# Patient Record
Sex: Female | Born: 1970 | Race: White | Hispanic: No | Marital: Married | Smoking: Never smoker
Health system: Southern US, Community
[De-identification: ages and names within clinical notes are randomized; demographics above are authoritative.]

## PROBLEM LIST (undated history)

## (undated) DIAGNOSIS — T7840XA Allergy, unspecified, initial encounter: Secondary | ICD-10-CM

## (undated) DIAGNOSIS — Z803 Family history of malignant neoplasm of breast: Secondary | ICD-10-CM

## (undated) DIAGNOSIS — N809 Endometriosis, unspecified: Secondary | ICD-10-CM

## (undated) HISTORY — DX: Endometriosis, unspecified: N80.9

## (undated) HISTORY — DX: Allergy, unspecified, initial encounter: T78.40XA

## (undated) HISTORY — DX: Family history of malignant neoplasm of breast: Z80.3

---

## 1995-01-15 LAB — HM PAP SMEAR: HM Pap smear: NORMAL

## 2001-05-05 HISTORY — PX: NASAL SINUS SURGERY: SHX719

## 2004-02-21 ENCOUNTER — Ambulatory Visit: Payer: Self-pay | Admitting: General Surgery

## 2006-05-05 HISTORY — PX: BREAST BIOPSY: SHX20

## 2006-07-08 ENCOUNTER — Ambulatory Visit: Payer: Self-pay | Admitting: Unknown Physician Specialty

## 2006-07-10 ENCOUNTER — Ambulatory Visit: Payer: Self-pay | Admitting: Unknown Physician Specialty

## 2007-05-06 HISTORY — PX: CHOLECYSTECTOMY: SHX55

## 2007-05-24 ENCOUNTER — Observation Stay: Payer: Self-pay

## 2007-05-26 ENCOUNTER — Inpatient Hospital Stay: Payer: Self-pay

## 2008-08-27 ENCOUNTER — Observation Stay: Payer: Self-pay

## 2008-11-17 ENCOUNTER — Inpatient Hospital Stay: Payer: Self-pay | Admitting: Obstetrics & Gynecology

## 2008-12-20 ENCOUNTER — Emergency Department: Payer: Self-pay | Admitting: Unknown Physician Specialty

## 2008-12-21 ENCOUNTER — Inpatient Hospital Stay: Payer: Self-pay | Admitting: General Surgery

## 2008-12-27 ENCOUNTER — Ambulatory Visit: Payer: Self-pay | Admitting: General Surgery

## 2009-08-31 IMAGING — US US OB US >=[ID] SNGL FETUS
1 series · 17 of 28 positions shown · non-contrast
Comparison: none

REASON FOR EXAM: upper abdominal pain
COMMENTS:

[Series 1: us ob us >=(id) sngl fetus · 17 of 44 slices shown]
[im 1/44]
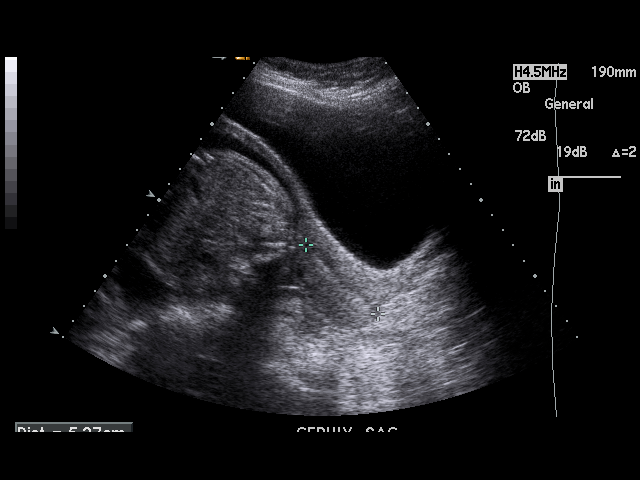
[im 4/44]
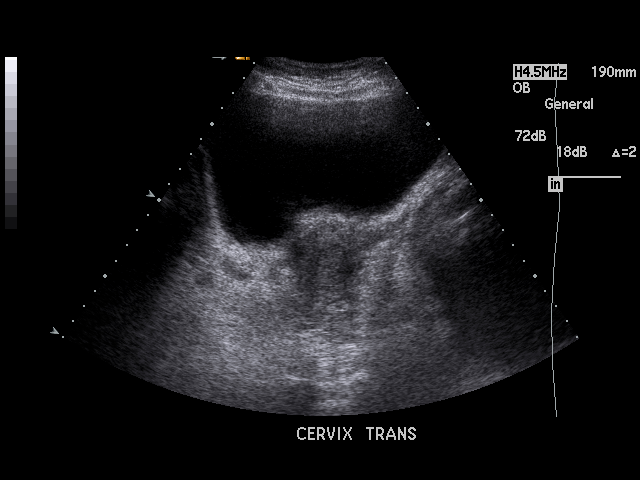
[im 7/44]
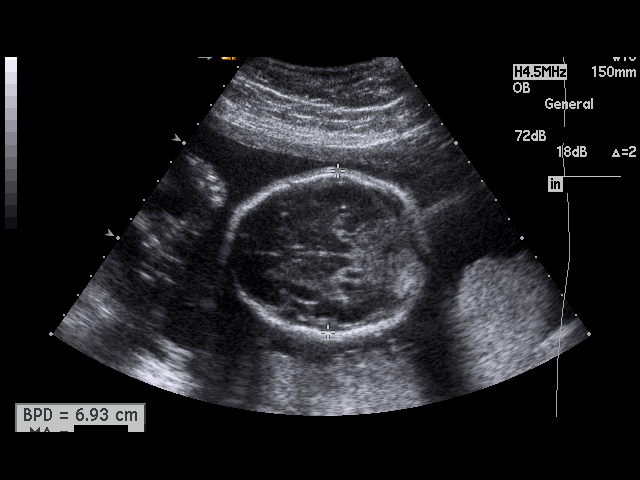
[im 8/44]
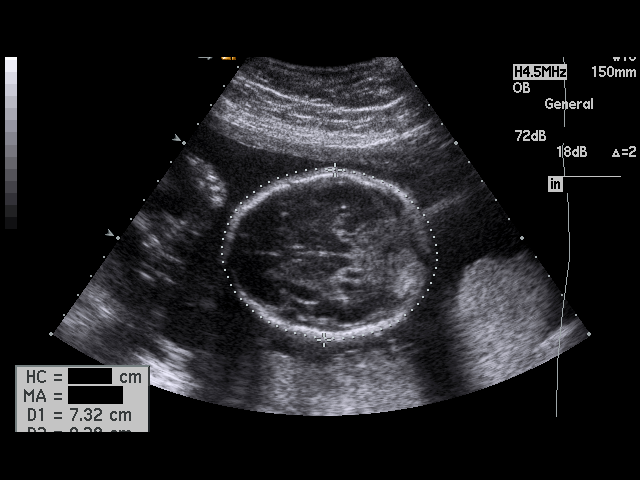
[im 12/44]
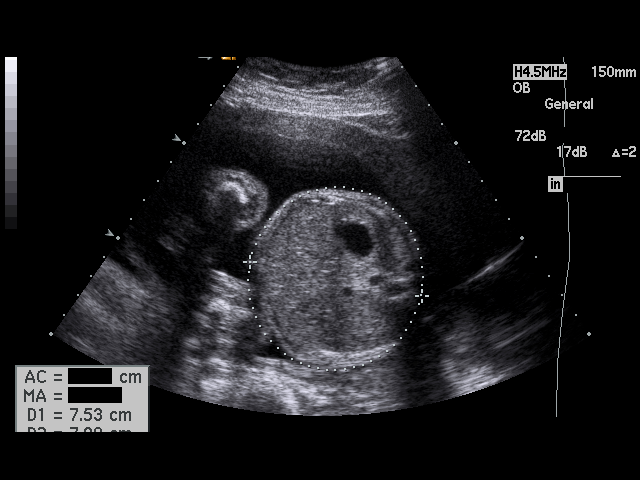
[im 15/44]
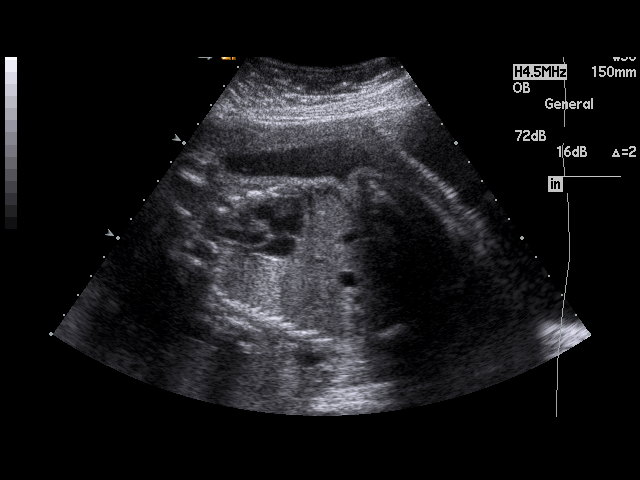
[im 16/44]
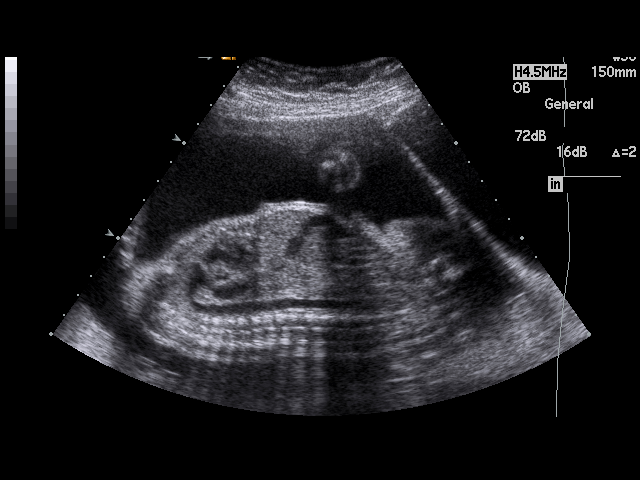
[im 20/44]
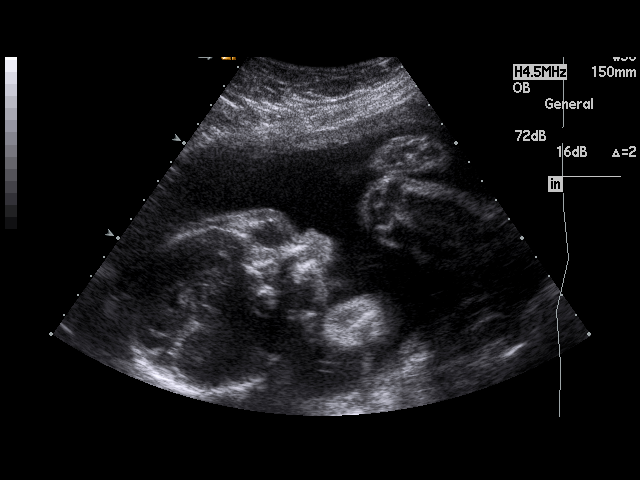
[im 23/44]
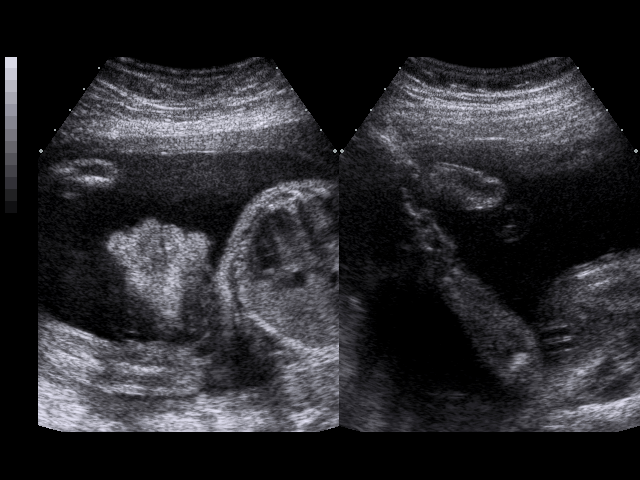
[im 24/44]
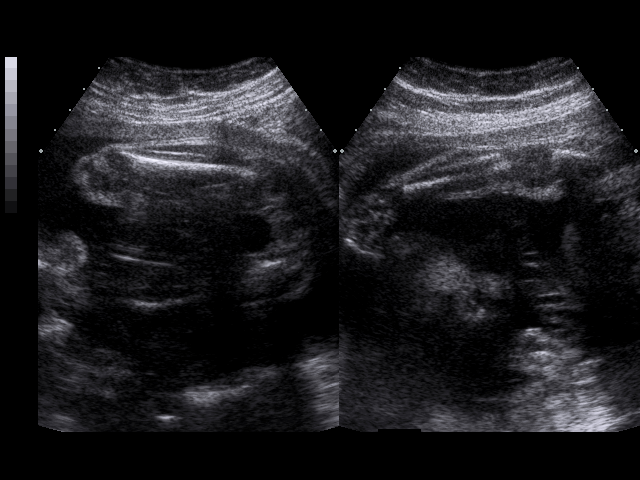
[im 28/44]
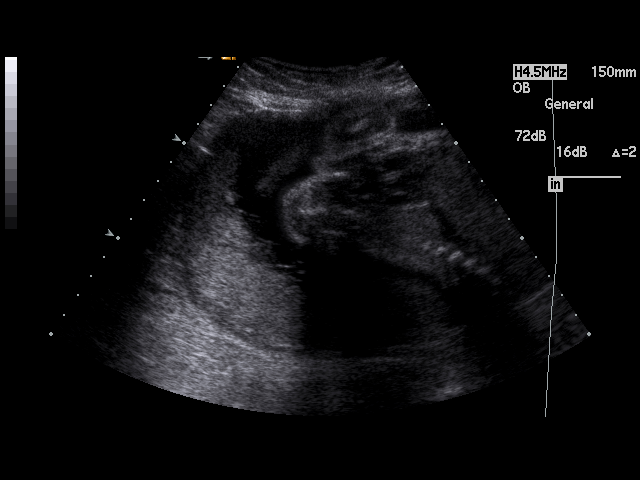
[im 29/44]
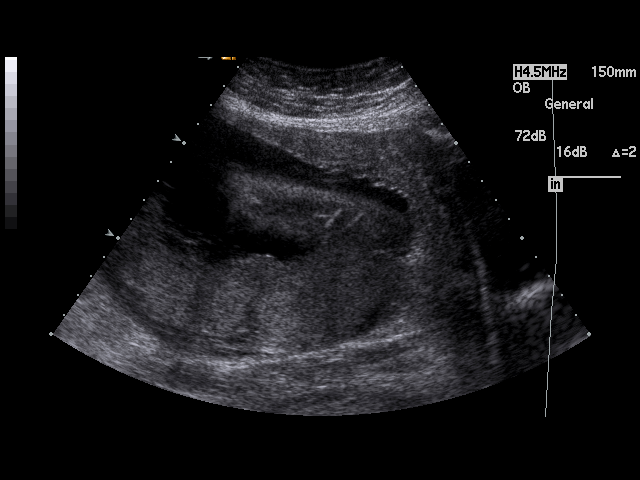
[im 32/44]
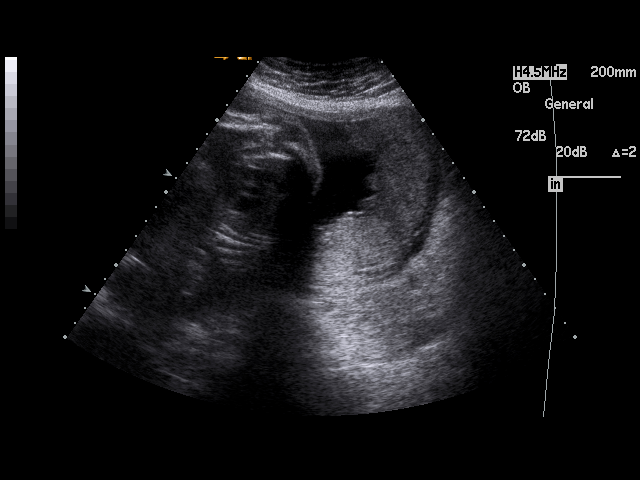
[im 36/44]
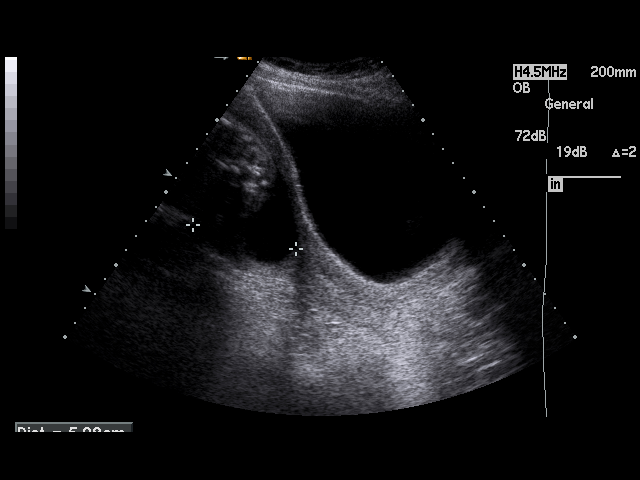
[im 37/44]
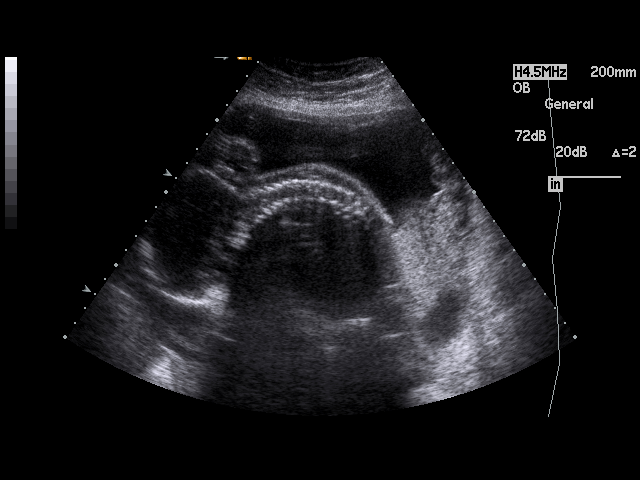
[im 40/44]
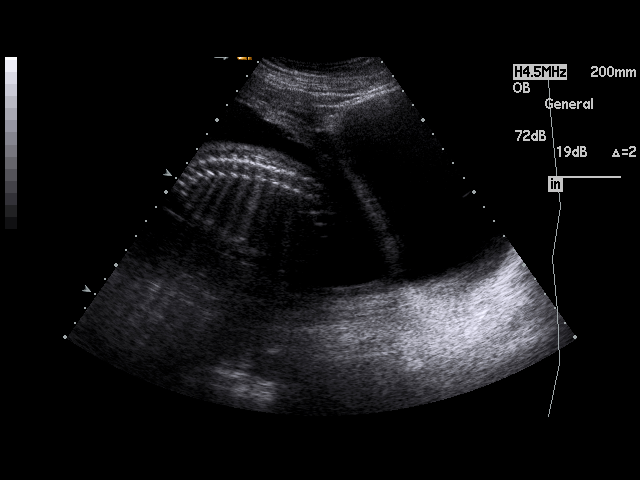
[im 44/44]
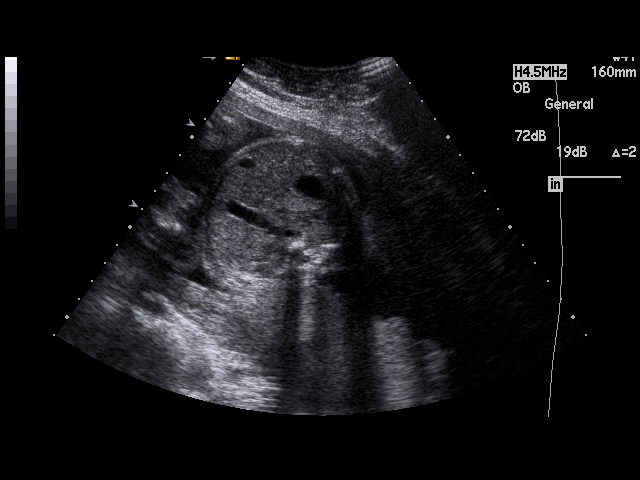

[17 of 28 positions shown; findings below may reference images not displayed]

PROCEDURE:     US  - US OB GREATER/OR EQUAL TO BHFDX  - August 27, 2008  [DATE]

RESULT:     A single viable intrauterine pregnancy is identified with an
estimated gestational age per ultrasound of 28 weeks-4 day, per LMP 27
weeks-3 days, EDC per ultrasound is 11-18-08, per LMP 11-23-2008.

Estimated fetal weight is 0244 grams plus/minus 161 grams, which corresponds
to 2 pounds, 2 ounces.

Fetal biometry:

BPD: 6.89 cm EGA 27 weeks-8 days
HC: 26.14 cm EGA 28 weeks-3 days
AC: 24.9 EGA 28 weeks-3 days
FL: 5.26 EGA 28 weeks-4 day

Estimated fetal heart rate is 136 beats per minute. The placenta is in a
RIGHT lateral posterior location.  Cardiac, trunk, extremity and
diaphragmatic movement are appreciated. Amnionic fluid appears unremarkable.
Evaluation of the fetal anatomy demonstrates no bladder, kidney, stomach,
cardiac, diaphragm, spine or ventricular abnormalities.
IMPRESSION: 1. Single viable intrauterine pregnancy as described above. No sonographic
abnormalities are appreciated.

## 2010-12-02 ENCOUNTER — Ambulatory Visit: Payer: Self-pay | Admitting: Obstetrics & Gynecology

## 2011-02-28 ENCOUNTER — Emergency Department: Payer: Self-pay | Admitting: Emergency Medicine

## 2014-12-11 ENCOUNTER — Ambulatory Visit (INDEPENDENT_AMBULATORY_CARE_PROVIDER_SITE_OTHER): Payer: No Typology Code available for payment source | Admitting: Family Medicine

## 2014-12-11 ENCOUNTER — Encounter: Payer: Self-pay | Admitting: *Deleted

## 2014-12-11 ENCOUNTER — Encounter: Payer: Self-pay | Admitting: Family Medicine

## 2014-12-11 VITALS — BP 102/70 | HR 62 | Temp 98.1°F | Resp 16 | Ht 65.0 in | Wt 175.0 lb

## 2014-12-11 DIAGNOSIS — J309 Allergic rhinitis, unspecified: Secondary | ICD-10-CM | POA: Diagnosis not present

## 2014-12-11 DIAGNOSIS — J019 Acute sinusitis, unspecified: Secondary | ICD-10-CM

## 2014-12-11 DIAGNOSIS — N809 Endometriosis, unspecified: Secondary | ICD-10-CM | POA: Insufficient documentation

## 2014-12-11 DIAGNOSIS — J329 Chronic sinusitis, unspecified: Secondary | ICD-10-CM | POA: Insufficient documentation

## 2014-12-11 MED ORDER — DOXYCYCLINE HYCLATE 100 MG PO TABS: 100.0000 mg | ORAL_TABLET | Freq: Two times a day (BID) | ORAL | Status: AC

## 2014-12-11 MED ORDER — PREDNISONE 10 MG PO TABS: ORAL_TABLET | ORAL | Status: AC

## 2014-12-11 NOTE — Progress Notes (Signed)
Patient: Melissa Neal Female    DOB: [birth year]   44 y.o.   MRN: [id number] Visit Date: 12/11/2014  Today's Provider: Mila Merry, MD   Chief Complaint  Patient presents with  . Sinusitis   Subjective:    Sinusitis This is a new problem. The current episode started in the past 7 days. The problem has been gradually worsening since onset. There has been no fever. Associated symptoms include congestion, coughing, headaches, sinus pressure and sneezing. Pertinent negatives include no chills, ear pain, hoarse voice, shortness of breath or sore throat. Past treatments include oral decongestants and sitting up. The treatment provided no relief.   She has similar sx in February which did not initially respond to Zpack, but rapidly improved on Doxy and prednisone. She states she had cold symptoms for a few weeks before sinus pain started and was frequently using nasal saline. She also has long history of allergic rhinitis for which she uses Flonase, and feels this has been effective until recently. She was previously followed by Dr. Jenne Campus for allergies and has considered getting allergy shots before her last pregnancy.     Allergies  Allergen Reactions  . Amoxicillin Itching  . Levaquin  [Levofloxacin In D5w] Rash   Previous Medications   FLUTICASONE (FLONASE) 50 MCG/ACT NASAL SPRAY    Place 2 sprays into the nose daily.   MULTIPLE VITAMIN PO    Take 1 capsule by mouth daily.    Review of Systems  Constitutional: Negative for chills.  HENT: Positive for congestion, sinus pressure and sneezing. Negative for ear pain, hoarse voice and sore throat.   Respiratory: Positive for cough. Negative for shortness of breath.   Cardiovascular: Positive for palpitations. Negative for chest pain.  Neurological: Positive for headaches. Negative for dizziness and light-headedness.    History  Substance Use Topics  . Smoking status: Never Smoker   . Smokeless tobacco: Not on file  .  Alcohol Use: No   Objective:   BP 102/70 mmHg  Pulse 62  Temp(Src) 98.1 F (36.7 C) (Oral)  Resp 16  Ht 5\' 5"  (1.651 m)  Wt 175 lb (79.379 kg)  BMI 29.12 kg/m2  SpO2 99%  Physical Exam General Appearance:    Alert, cooperative, no distress  HENT:   bilateral TM normal without fluid or infection, neck without nodes, throat normal without erythema or exudate, right frontal sinus tender, post nasal drip noted and nasal mucosa congested  Eyes:    PERRL, conjunctiva/corneas clear, EOM's intact       Lungs:     Clear to auscultation bilaterally, respirations unlabored  Heart:    Regular rate and rhythm  Neurologic:   Awake, alert, oriented x 3. No apparent focal neurological           defect.            Assessment & Plan:     1. Acute sinusitis, recurrence not specified, unspecified location  - doxycycline (VIBRA-TABS) 100 MG tablet; Take 1 tablet (100 mg total) by mouth 2 (two) times daily.  Dispense: 20 tablet; Refill: 0 - predniSONE (DELTASONE) 10 MG tablet; 6 tablets for 1 day, then 5 for 1 day, then 4 for 1 day, then 3 for 1 day, then 2 for 1 day then 1 for 1 day.  Dispense: 21 tablet; Refill: 0  2. Allergic rhinitis, unspecified allergic rhinitis type Seemed to be well controlled until current acute sinusitis. She feels like she may  need to get back to ENT. She will continue Flonase and nasal saline as needed for the time being, and call back for ENT referral is she has any more trouble after current infection resolved.         Mila Merry, MD  [facility]

## 2015-05-04 ENCOUNTER — Encounter: Payer: Self-pay | Admitting: Family Medicine

## 2015-06-16 ENCOUNTER — Other Ambulatory Visit: Payer: Self-pay | Admitting: Family Medicine

## 2015-06-16 DIAGNOSIS — J309 Allergic rhinitis, unspecified: Secondary | ICD-10-CM

## 2017-10-29 ENCOUNTER — Ambulatory Visit (INDEPENDENT_AMBULATORY_CARE_PROVIDER_SITE_OTHER): Payer: PRIVATE HEALTH INSURANCE | Admitting: Obstetrics and Gynecology

## 2017-10-29 ENCOUNTER — Encounter: Payer: Self-pay | Admitting: Obstetrics and Gynecology

## 2017-10-29 VITALS — BP 102/62 | HR 64 | Ht 65.0 in | Wt 174.0 lb

## 2017-10-29 DIAGNOSIS — Z1329 Encounter for screening for other suspected endocrine disorder: Secondary | ICD-10-CM | POA: Diagnosis not present

## 2017-10-29 DIAGNOSIS — Z1239 Encounter for other screening for malignant neoplasm of breast: Secondary | ICD-10-CM

## 2017-10-29 DIAGNOSIS — Z13220 Encounter for screening for lipoid disorders: Secondary | ICD-10-CM

## 2017-10-29 DIAGNOSIS — Z01419 Encounter for gynecological examination (general) (routine) without abnormal findings: Secondary | ICD-10-CM

## 2017-10-29 DIAGNOSIS — Z130 Encounter for screening for diseases of the blood and blood-forming organs and certain disorders involving the immune mechanism: Secondary | ICD-10-CM | POA: Diagnosis not present

## 2017-10-29 DIAGNOSIS — Z1231 Encounter for screening mammogram for malignant neoplasm of breast: Secondary | ICD-10-CM

## 2017-10-29 DIAGNOSIS — Z803 Family history of malignant neoplasm of breast: Secondary | ICD-10-CM | POA: Diagnosis not present

## 2017-10-29 DIAGNOSIS — Z124 Encounter for screening for malignant neoplasm of cervix: Secondary | ICD-10-CM

## 2017-10-29 DIAGNOSIS — Z0000 Encounter for general adult medical examination without abnormal findings: Secondary | ICD-10-CM

## 2017-10-29 NOTE — Progress Notes (Signed)
Gynecology Annual Exam  PCP: Malva LimesFisher, Donald E, MD  Chief Complaint:  Chief Complaint  Patient presents with  . Gynecologic Exam    History of Present Illness: Patient is a 47 y.o. [id number] presents for annual exam. The patient has no complaints today.   LMP: Patient's last menstrual period was 10/22/2017 (exact date). Average Interval: regular, 28 days Duration of flow: 7 days, moderate blow Clots: no Intermenstrual Bleeding: no Postcoital Bleeding: no Dysmenorrhea: yes   The patient is sexually active. She currently uses vasectomy for contraception. She denies dyspareunia.  The patient does perform self breast exams.  There is notable family history of breast or ovarian cancer in her family. Her mother had breast cancer at 1372  The patient wears seatbelts: yes.   The patient has regular exercise: no.    The patient denies current symptoms of depression.    Review of Systems: ROS  Past Medical History:  Past Medical History:  Diagnosis Date  . Allergy   . Endometriosis     Past Surgical History:  Past Surgical History:  Procedure Laterality Date  . BREAST BIOPSY  05/2006  . CHOLECYSTECTOMY  2009  . NASAL SINUS SURGERY  2003    Gynecologic History:  Patient's last menstrual period was 10/22/2017 (exact date). Contraception: vasectomy Last Pap: Results were: 2016 normal  Last mammogram: 2016 Results were: BI-RAD I  Obstetric History: [id number]: G4P4004  Family History:  Family History  Problem Relation Age of Onset  . Hypertension Mother   . Breast cancer Mother   . Hypertension Father   . Colon cancer Father   . Alzheimer's disease Father   . Epilepsy Maternal Uncle   . Hypertension Maternal Grandfather   . CAD Maternal Grandfather   . Lung cancer Maternal Grandfather   . Parkinsonism Paternal Grandmother     Social History:  Social History   Socioeconomic History  . Marital status: Married    Spouse name: Not on file  . Number of children: Not on file    . Years of education: Not on file  . Highest education level: Not on file  Occupational History  . Not on file  Social Needs  . Financial resource strain: Not on file  . Food insecurity:    Worry: Not on file    Inability: Not on file  . Transportation needs:    Medical: Not on file    Non-medical: Not on file  Tobacco Use  . Smoking status: Never Smoker  . Smokeless tobacco: Never Used  Substance and Sexual Activity  . Alcohol use: No    Alcohol/week: 0.0 oz  . Drug use: No  . Sexual activity: Yes    Birth control/protection: Other-see comments    Comment: vasectomy  Lifestyle  . Physical activity:    Days per week: 0 days    Minutes per session: 0 min  . Stress: Not at all  Relationships  . Social connections:    Talks on phone: Not on file    Gets together: Not on file    Attends religious service: Not on file    Active member of club or organization: Not on file    Attends meetings of clubs or organizations: Not on file    Relationship status: Not on file  . Intimate partner violence:    Fear of current or ex partner: Not on file    Emotionally abused: Not on file    Physically abused: Not on file  Forced sexual activity: Not on file  Other Topics Concern  . Not on file  Social History Narrative  . Not on file    Allergies:  Allergies  Allergen Reactions  . Amoxicillin Itching  . Levaquin  [Levofloxacin In D5w] Rash    Medications: Prior to Admission medications   Medication Sig Start Date End Date Taking? Authorizing Provider  Calcium-Magnesium-Vitamin D 200-100-33.3 MG-MG-UNIT CAPS Take by mouth.   Yes [provider]  MULTIPLE VITAMIN PO Take 1 capsule by mouth daily.   Yes [provider]  OVER THE COUNTER MEDICATION    Yes [provider]    Physical Exam Vitals: Blood pressure 102/62, pulse 64, height 5\' 5"  (1.651 m), weight 174 lb (78.9 kg), last menstrual period 10/22/2017.  General: NAD HEENT: normocephalic,  anicteric Thyroid: no enlargement, no palpable nodules Pulmonary: No increased work of breathing, CTAB Cardiovascular: RRR, distal pulses 2+ Breast: Breast symmetrical, no tenderness, no palpable nodules or masses, no skin or nipple retraction present, no nipple discharge.  No axillary or supraclavicular lymphadenopathy. Abdomen: NABS, soft, non-tender, non-distended.  Umbilicus without lesions.  No hepatomegaly, splenomegaly or masses palpable. No evidence of hernia  Genitourinary:  External: Normal external female genitalia.  Normal urethral meatus, normal Bartholin's and Skene's glands.    Vagina: Normal vaginal mucosa, no evidence of prolapse.    Cervix: Grossly normal in appearance, no bleeding  Uterus: Non-enlarged, mobile, normal contour.  No CMT  Adnexa: ovaries non-enlarged, no adnexal masses  Rectal: deferred  Lymphatic: no evidence of inguinal lymphadenopathy Extremities: no edema, erythema, or tenderness Neurologic: Grossly intact Psychiatric: mood appropriate, affect full  Female chaperone present for pelvic and breast  portions of the physical exam    Assessment: 47 y.o. [id number] routine annual exam  Plan: Problem List Items Addressed This Visit    None      1) Mammogram - recommend yearly screening mammogram.  Mammogram Was ordered today   2) STI screening  was offered and declined  3) ASCCP guidelines and rational discussed.  Patient opts for every 3 years screening interval  4) Contraception - the patient is currently using  vasectomy.  She is happy with her current form of contraception and plans to continue  5) Colonoscopy -- Screening recommended starting at age 92 for average risk individuals, age 29 for individuals deemed at increased risk (including African Americans) and recommended to continue until age 50.  For patient age 39-85 individualized approach is recommended.  Gold standard screening is via colonoscopy, Cologuard screening is an acceptable  alternative for patient unwilling or unable to undergo colonoscopy.  "Colorectal cancer screening for average?risk adults: 2018 guideline update from the American Cancer Society"[place]: A Cancer Journal for Clinicians: Oct 01, 2016   6) Routine healthcare maintenance including cholesterol, diabetes screening discussed To return fasting at a later date  7) No follow-ups on file.  Adelene Idler MD [facility], [facility] 10/29/17 2:55 PM

## 2017-10-30 ENCOUNTER — Other Ambulatory Visit: Payer: PRIVATE HEALTH INSURANCE

## 2017-10-30 DIAGNOSIS — Z0000 Encounter for general adult medical examination without abnormal findings: Secondary | ICD-10-CM

## 2017-10-30 DIAGNOSIS — Z13220 Encounter for screening for lipoid disorders: Secondary | ICD-10-CM

## 2017-10-30 DIAGNOSIS — Z1329 Encounter for screening for other suspected endocrine disorder: Secondary | ICD-10-CM

## 2017-10-30 DIAGNOSIS — Z130 Encounter for screening for diseases of the blood and blood-forming organs and certain disorders involving the immune mechanism: Secondary | ICD-10-CM

## 2017-10-31 LAB — CBC WITH DIFFERENTIAL
BASOS: 0 %
Basophils Absolute: 0 10*3/uL (ref 0.0–0.2)
EOS (ABSOLUTE): 0.2 10*3/uL (ref 0.0–0.4)
EOS: 3 %
HEMATOCRIT: 43.3 % (ref 34.0–46.6)
HEMOGLOBIN: 13.8 g/dL (ref 11.1–15.9)
IMMATURE GRANS (ABS): 0 10*3/uL (ref 0.0–0.1)
Immature Granulocytes: 0 %
LYMPHS ABS: 2.3 10*3/uL (ref 0.7–3.1)
Lymphs: 33 %
MCH: 30.1 pg (ref 26.6–33.0)
MCHC: 31.9 g/dL (ref 31.5–35.7)
MCV: 95 fL (ref 79–97)
Monocytes Absolute: 0.4 10*3/uL (ref 0.1–0.9)
Monocytes: 7 %
NEUTROS ABS: 3.9 10*3/uL (ref 1.4–7.0)
Neutrophils: 57 %
RBC: 4.58 x10E6/uL (ref 3.77–5.28)
RDW: 14.7 % (ref 12.3–15.4)
WBC: 6.8 10*3/uL (ref 3.4–10.8)

## 2017-10-31 LAB — BASIC METABOLIC PANEL
BUN / CREAT RATIO: 16 (ref 9–23)
BUN: 14 mg/dL (ref 6–24)
CO2: 26 mmol/L (ref 20–29)
CREATININE: 0.88 mg/dL (ref 0.57–1.00)
Calcium: 9.3 mg/dL (ref 8.7–10.2)
Chloride: 102 mmol/L (ref 96–106)
GFR calc Af Amer: 90 mL/min/{1.73_m2} (ref >59)
GFR, EST NON AFRICAN AMERICAN: 78 mL/min/{1.73_m2} (ref >59)
Glucose: 92 mg/dL (ref 65–99)
Potassium: 4.6 mmol/L (ref 3.5–5.2)
SODIUM: 141 mmol/L (ref 134–144)

## 2017-10-31 LAB — LIPID PANEL
Chol/HDL Ratio: 3.6 ratio (ref 0.0–4.4)
Cholesterol, Total: 213 mg/dL — ABNORMAL HIGH (ref 100–199)
HDL: 60 mg/dL (ref >39)
LDL Calculated: 128 mg/dL — ABNORMAL HIGH (ref 0–99)
Triglycerides: 126 mg/dL (ref 0–149)
VLDL Cholesterol Cal: 25 mg/dL (ref 5–40)

## 2017-10-31 LAB — TSH+FREE T4
Free T4: 1.01 ng/dL (ref 0.82–1.77)
TSH: 3.24 u[IU]/mL (ref 0.450–4.500)

## 2017-11-05 LAB — PAPIG, HPV, RFX 16/18
HPV, high-risk: NEGATIVE
PAP SMEAR COMMENT: 0

## 2017-11-09 NOTE — Progress Notes (Signed)
ASUCUS , HPV negative Repeat pap in 3 years, [facility]

## 2017-11-23 NOTE — Progress Notes (Signed)
Hello Melissa Neal,  The labs from your annual visit look good. Using the results of your lipid panel I calculated your ten year risk of heart disease and it was 0.6% which is very low. This is all good news.  Thank you,  Dr. Jerene PitchSchuman

## 2020-04-30 ENCOUNTER — Ambulatory Visit: Payer: PRIVATE HEALTH INSURANCE | Admitting: Obstetrics and Gynecology

## 2020-07-03 ENCOUNTER — Other Ambulatory Visit: Payer: Self-pay | Admitting: Obstetrics and Gynecology

## 2020-07-19 ENCOUNTER — Ambulatory Visit (INDEPENDENT_AMBULATORY_CARE_PROVIDER_SITE_OTHER): Payer: PRIVATE HEALTH INSURANCE | Admitting: Obstetrics and Gynecology

## 2020-07-19 ENCOUNTER — Other Ambulatory Visit (HOSPITAL_COMMUNITY)
Admission: RE | Admit: 2020-07-19 | Discharge: 2020-07-19 | Disposition: A | Discharge status: Home / Self Care | Payer: Self-pay | Source: Ambulatory Visit | Attending: Obstetrics and Gynecology | Admitting: Obstetrics and Gynecology

## 2020-07-19 ENCOUNTER — Encounter: Payer: Self-pay | Admitting: Obstetrics and Gynecology

## 2020-07-19 ENCOUNTER — Other Ambulatory Visit: Payer: Self-pay

## 2020-07-19 VITALS — BP 120/70 | Ht 65.0 in | Wt 193.0 lb

## 2020-07-19 DIAGNOSIS — Z0000 Encounter for general adult medical examination without abnormal findings: Secondary | ICD-10-CM

## 2020-07-19 DIAGNOSIS — Z01419 Encounter for gynecological examination (general) (routine) without abnormal findings: Secondary | ICD-10-CM | POA: Diagnosis not present

## 2020-07-19 DIAGNOSIS — Z124 Encounter for screening for malignant neoplasm of cervix: Secondary | ICD-10-CM | POA: Insufficient documentation

## 2020-07-19 DIAGNOSIS — Z1231 Encounter for screening mammogram for malignant neoplasm of breast: Secondary | ICD-10-CM | POA: Diagnosis not present

## 2020-07-19 DIAGNOSIS — N939 Abnormal uterine and vaginal bleeding, unspecified: Secondary | ICD-10-CM

## 2020-07-19 DIAGNOSIS — Z1239 Encounter for other screening for malignant neoplasm of breast: Secondary | ICD-10-CM

## 2020-07-19 DIAGNOSIS — Z1211 Encounter for screening for malignant neoplasm of colon: Secondary | ICD-10-CM

## 2020-07-19 NOTE — Patient Instructions (Signed)
Institute [employer] Recommended Dietary Allowances for Calcium and Vitamin D  Age (yr) Calcium Recommended Dietary Allowance (mg/day) Vitamin D Recommended Dietary Allowance (international units/day)  9-18 1,300 600  19-50 1,000 600  51-70 1,200 600  71 and older 1,200 800  Data from [employer]. Dietary reference intakes: calcium, vitamin D. [place], [place]: National Academies [employer]; 2011.    Exercising to Stay Healthy To become healthy and stay healthy, it is recommended that you do moderate-intensity and vigorous-intensity exercise. You can tell that you are exercising at a moderate intensity if your heart starts beating faster and you start breathing faster but can still hold a conversation. You can tell that you are exercising at a vigorous intensity if you are breathing much harder and faster and cannot hold a conversation while exercising. Exercising regularly is important. It has many health benefits, such as:  Improving overall fitness, flexibility, and endurance.  Increasing bone density.  Helping with weight control.  Decreasing body fat.  Increasing muscle strength.  Reducing stress and tension.  Improving overall health. How often should I exercise? Choose an activity that you enjoy, and set realistic goals. Your health care provider can help you make an activity plan that works for you. Exercise regularly as told by your health care provider. This may include:  Doing strength training two times a week, such as: ? Lifting weights. ? Using resistance bands. ? Push-ups. ? Sit-ups. ? Yoga.  Doing a certain intensity of exercise for a given amount of time. Choose from these options: ? A total of 150 minutes of moderate-intensity exercise every week. ? A total of 75 minutes of vigorous-intensity exercise every week. ? A mix of moderate-intensity and vigorous-intensity exercise every week. Children, pregnant women, people who have not exercised  regularly, people who are overweight, and older adults may need to talk with a health care provider about what activities are safe to do. If you have a medical condition, be sure to talk with your health care provider before you start a new exercise program. What are some exercise ideas? Moderate-intensity exercise ideas include:  Walking 1 mile (1.6 km) in about 15 minutes.  Biking.  Hiking.  Golfing.  Dancing.  Water aerobics. Vigorous-intensity exercise ideas include:  Walking 4.5 miles (7.2 km) or more in about 1 hour.  Jogging or running 5 miles (8 km) in about 1 hour.  Biking 10 miles (16.1 km) or more in about 1 hour.  Lap swimming.  Roller-skating or in-line skating.  Cross-country skiing.  Vigorous competitive sports, such as football, basketball, and soccer.  Jumping rope.  Aerobic dancing.   What are some everyday activities that can help me to get exercise?  Yard work, such as: ? Pushing a lawn mower. ? Raking and bagging leaves.  Washing your car.  Pushing a stroller.  Shoveling snow.  Gardening.  Washing windows or floors. How can I be more active in my day-to-day activities?  Use stairs instead of an elevator.  Take a walk during your lunch break.  If you drive, park your car farther away from your work or school.  If you take public transportation, get off one stop early and walk the rest of the way.  Stand up or walk around during all of your indoor phone calls.  Get up, stretch, and walk around every 30 minutes throughout the day.  Enjoy exercise with a friend. Support to continue exercising will help you keep a regular routine of activity. What guidelines   can I follow while exercising?  Before you start a new exercise program, talk with your health care provider.  Do not exercise so much that you hurt yourself, feel dizzy, or get very short of breath.  Wear comfortable clothes and wear shoes with good support.  Drink plenty of  water while you exercise to prevent dehydration or heat stroke.  Work out until your breathing and your heartbeat get faster. Where to find more information  [place] Department of Health and Human Services: www.hhs.gov  Centers for Disease Control and Prevention (CDC): www.cdc.gov Summary  Exercising regularly is important. It will improve your overall fitness, flexibility, and endurance.  Regular exercise also will improve your overall health. It can help you control your weight, reduce stress, and improve your bone density.  Do not exercise so much that you hurt yourself, feel dizzy, or get very short of breath.  Before you start a new exercise program, talk with your health care provider. This information is not intended to replace advice given to you by your health care provider. Make sure you discuss any questions you have with your health care provider. Document Revised: 04/03/2017 Document Reviewed: 03/12/2017 Elsevier Patient Education  2021 Elsevier Inc.   Budget-Friendly Healthy Eating There are many ways to save money at the grocery store and continue to eat healthy. You can be successful if you:  Plan meals according to your budget.  Make a grocery list and only purchase food according to your grocery list.  Prepare food yourself at home. What are tips for following this plan? Reading food labels  Compare food labels between brand name foods and the store brand. Often the nutritional value is the same, but the store brand is lower cost.  Look for products that do not have added sugar, fat, or salt (sodium). These often cost the same but are healthier for you. Products may be labeled as: ? Sugar-free. ? Nonfat. ? Low-fat. ? Sodium-free. ? Low-sodium.  Look for lean ground beef labeled as at least 92% lean and 8% fat. Shopping  Buy only the items on your grocery list and go only to the areas of the store that have the items on your list.  Use coupons only for  foods and brands you normally buy. Avoid buying items you wouldn't normally buy simply because they are on sale.  Check online and in newspapers for weekly deals.  Buy healthy items from the bulk bins when available, such as herbs, spices, flour, pasta, nuts, and dried fruit.  Buy fruits and vegetables that are in season. Prices are usually lower on in-season produce.  Look at the unit price on the price tag. Use it to compare different brands and sizes to find out which item is the best deal.  Choose healthy items that are often low-cost, such as carrots, potatoes, apples, bananas, and oranges. Dried or canned beans are a low-cost protein source.  Buy in bulk and freeze extra food. Items you can buy in bulk include meats, fish, poultry, frozen fruits, and frozen vegetables.  Avoid buying "ready-to-eat" foods, such as pre-cut fruits and vegetables and pre-made salads.  If possible, shop around to discover where you can find the best prices. Consider other retailers such as dollar stores, larger wholesale stores, local fruit and vegetable stands, and farmers markets.  Do not shop when you are hungry. If you shop while hungry, it may be hard to stick to your list and budget.  Resist impulse buying. Use your grocery   list as your official plan for the week.  Buy a variety of vegetables and fruits by purchasing fresh, frozen, and canned items.  Look at the top and bottom shelves for deals. Foods at eye level (eye level of an adult or child) are usually more expensive.  Be efficient with your time when shopping. The more time you spend at the store, the more money you are likely to spend.  To save money when choosing more expensive foods like meats and dairy: ? Choose cheaper cuts of meat, such as bone-in chicken thighs and drumsticks instead of skinless and boneless chicken. When you are ready to prepare the chicken, you can remove the skin yourself to make it healthier. ? Choose lean meats  like chicken or turkey instead of beef. ? Choose canned seafood, such as tuna, salmon, or sardines. ? Buy eggs as a low-cost source of protein. ? Buy dried beans and peas, such as lentils, split peas, or kidney beans instead of meats. Dried beans and peas are a good alternative source of protein. ? Buy the larger tubs of yogurt instead of individual-sized containers.  Choose water instead of sodas and other sweetened beverages.  Avoid buying chips, cookies, and other "junk food." These items are usually expensive and not healthy.   Cooking  Make extra food and freeze the extras in meal-sized containers or in individual portions for fast meals and snacks.  Pre-cook on days when you have extra time to prepare meals in advance. You can keep these meals in the fridge or freezer and reheat for a quick meal.  When you come home from the grocery store, wash, peel, and cut fruits and vegetables so they are ready to use and eat. This will help reduce food waste. Meal planning  Do not eat out or get fast food. Prepare food at home.  Make a grocery list and make sure to bring it with you to the store. If you have a smart phone, you could use your phone to create your shopping list.  Plan meals and snacks according to a grocery list and budget you create.  Use leftovers in your meal plan for the week.  Look for recipes where you can cook once and make enough food for two meals.  Prepare budget-friendly types of meals like stews, casseroles, and stir-fry dishes.  Try some meatless meals or try "no cook" meals like salads.  Make sure that half your plate is filled with fruits or vegetables. Choose from fresh, frozen, or canned fruits and vegetables. If eating canned, remember to rinse them before eating. This will remove any excess salt added for packaging. Summary  Eating healthy on a budget is possible if you plan your meals according to your budget, purchase according to your budget and  grocery list, and prepare food yourself.  Tips for buying more food on a limited budget include buying generic brands, using coupons only for foods you normally buy, and buying healthy items from the bulk bins when available.  Tips for buying cheaper food to replace expensive food include choosing cheaper, lean cuts of meat, and buying dried beans and peas. This information is not intended to replace advice given to you by your health care provider. Make sure you discuss any questions you have with your health care provider. Document Revised: 02/02/2020 Document Reviewed: 02/02/2020 Elsevier Patient Education  2021 [employer] Inc.   Bone Health Bones protect organs, store calcium, anchor muscles, and support the whole body. Keeping your bones   strong is important, especially as you get older. You can take actions to help keep your bones strong and healthy. Why is keeping my bones healthy important? Keeping your bones healthy is important because your body constantly replaces bone cells. Cells get old, and new cells take their place. As we age, we lose bone cells because the body may not be able to make enough new cells to replace the old cells. The amount of bone cells and bone tissue you have is referred to as bone mass. The higher your bone mass, the stronger your bones. The aging process leads to an overall loss of bone mass in the body, which can increase the likelihood of:  Joint pain and stiffness.  Broken bones.  A condition in which the bones become weak and brittle (osteoporosis). A large decline in bone mass occurs in older adults. In women, it occurs about the time of menopause.   What actions can I take to keep my bones healthy? Good health habits are important for maintaining healthy bones. This includes eating nutritious foods and exercising regularly. To have healthy bones, you need to get enough of the right minerals and vitamins. Most nutrition experts recommend getting these  nutrients from the foods that you eat. In some cases, taking supplements may also be recommended. Doing certain types of exercise is also important for bone health. What are the nutritional recommendations for healthy bones? Eating a well-balanced diet with plenty of calcium and vitamin D will help to protect your bones. Nutritional recommendations vary from person to person. Ask your health care provider what is healthy for you. Here are some general guidelines. Get enough calcium Calcium is the most important (essential) mineral for bone health. Most people can get enough calcium from their diet, but supplements may be recommended for people who are at risk for osteoporosis. Good sources of calcium include:  Dairy products, such as low-fat or nonfat milk, cheese, and yogurt.  Dark green leafy vegetables, such as bok choy and broccoli.  Calcium-fortified foods, such as orange juice, cereal, bread, soy beverages, and tofu products.  Nuts, such as almonds. Follow these recommended amounts for daily calcium intake:  Children, age 1-3: 700 mg.  Children, age 4-8: 1,000 mg.  Children, age 9-13: 1,300 mg.  Teens, age 14-18: 1,300 mg.  Adults, age 19-50: 1,000 mg.  Adults, age 51-70: ? Men: 1,000 mg. ? Women: 1,200 mg.  Adults, age 71 or older: 1,200 mg.  Pregnant and breastfeeding females: ? Teens: 1,300 mg. ? Adults: 1,000 mg. Get enough vitamin D Vitamin D is the most essential vitamin for bone health. It helps the body absorb calcium. Sunlight stimulates the skin to make vitamin D, so be sure to get enough sunlight. If you live in a cold climate or you do not get outside often, your health care provider may recommend that you take vitamin D supplements. Good sources of vitamin D in your diet include:  Egg yolks.  Saltwater fish.  Milk and cereal fortified with vitamin D. Follow these recommended amounts for daily vitamin D intake:  Children and teens, age 1-18: 600  international units.  Adults, age 50 or younger: 400-800 international units.  Adults, age 51 or older: 800-1,000 international units. Get other important nutrients Other nutrients that are important for bone health include:  Phosphorus. This mineral is found in meat, poultry, dairy foods, nuts, and legumes. The recommended daily intake for adult men and adult women is 700 mg.  Magnesium. This mineral   is found in seeds, nuts, dark green vegetables, and legumes. The recommended daily intake for adult men is 400-420 mg. For adult women, it is 310-320 mg.  Vitamin K. This vitamin is found in green leafy vegetables. The recommended daily intake is 120 mg for adult men and 90 mg for adult women.   What type of physical activity is best for building and maintaining healthy bones? Weight-bearing and strength-building activities are important for building and maintaining healthy bones. Weight-bearing activities cause muscles and bones to work against gravity. Strength-building activities increase the strength of the muscles that support bones. Weight-bearing and muscle-building activities include:  Walking and hiking.  Jogging and running.  Dancing.  Gym exercises.  Lifting weights.  Tennis and racquetball.  Climbing stairs.  Aerobics. Adults should get at least 30 minutes of moderate physical activity on most days. Children should get at least 60 minutes of moderate physical activity on most days. Ask your health care provider what type of exercise is best for you.   How can I find out if my bone mass is low? Bone mass can be measured with an X-ray test called a bone mineral density (BMD) test. This test is recommended for all women who are age 65 or older. It may also be recommended for:  Men who are age 70 or older.  People who are at risk for osteoporosis because of: ? Having bones that break easily. ? Having a long-term disease that weakens bones, such as kidney disease or  rheumatoid arthritis. ? Having menopause earlier than normal. ? Taking medicine that weakens bones, such as steroids, thyroid hormones, or hormone treatment for breast cancer or prostate cancer. ? Smoking. ? Drinking three or more alcoholic drinks a day. If you find that you have a low bone mass, you may be able to prevent osteoporosis or further bone loss by changing your diet and lifestyle. Where can I find more information? For more information, check out the following websites:  National Osteoporosis Foundation: www.nof.org/patients  National Institutes of Health: www.bones.nih.gov  International Osteoporosis Foundation: www.iofbonehealth.org Summary  The aging process leads to an overall loss of bone mass in the body, which can increase the likelihood of broken bones and osteoporosis.  Eating a well-balanced diet with plenty of calcium and vitamin D will help to protect your bones.  Weight-bearing and strength-building activities are also important for building and maintaining strong bones.  Bone mass can be measured with an X-ray test called a bone mineral density (BMD) test. This information is not intended to replace advice given to you by your health care provider. Make sure you discuss any questions you have with your health care provider. Document Revised: 05/18/2017 Document Reviewed: 05/18/2017 Elsevier Patient Education  2021 [employer] Inc.   

## 2020-07-19 NOTE — Progress Notes (Signed)
Gynecology Annual Exam  PCP: Malva Limes, MD  Chief Complaint:  Chief Complaint  Patient presents with  . Gynecologic Exam    History of Present Illness: Patient is a 50 y.o. [id number] presents for annual exam. The patient has no complaints today.   LMP: Patient's last menstrual period was 07/09/2020. Average Interval: irregular, monthly to every 3-4 months Duration of flow: 7 days Heavy Menses: yes Intermenstrual Bleeding: no Dysmenorrhea: no  The patient does perform self breast exams.  There is notable family history of breast or ovarian cancer in her family.  The patient has regular exercise: yes weight training 1-3 days a week  The patient denies current symptoms of depression.   She reports that she is been having increasingly heavy menstrual cycles.  She reports that her menstrual cycles have been heavy for at least 10 years.  She reports that last month she had a very bad menstrual cycle where she was up every hour even during the night to change her tampon.  She reports that she does past palm-sized clots during her menstrual cycle.  She does have sensations of gushing or flooding of blood.  She has had accidents where she bleeds through her clothing.  She denies any severe pain which prevents her from attending work or school. She reports for the last 2 to 3 years her menstrual cycle has been occurring every 3 to 4 months.   Review of Systems: Review of Systems  Constitutional: Negative for chills, fever, malaise/fatigue and weight loss.  HENT: Negative for congestion, hearing loss and sinus pain.   Eyes: Negative for blurred vision and double vision.  Respiratory: Negative for cough, sputum production, shortness of breath and wheezing.   Cardiovascular: Negative for chest pain, palpitations, orthopnea and leg swelling.  Gastrointestinal: Negative for abdominal pain, constipation, diarrhea, nausea and vomiting.  Genitourinary: Negative for dysuria, flank pain,  frequency, hematuria and urgency.  Musculoskeletal: Negative for back pain, falls and joint pain.  Skin: Negative for itching and rash.  Neurological: Negative for dizziness and headaches.  Psychiatric/Behavioral: Negative for depression, substance abuse and suicidal ideas. The patient is not nervous/anxious.     Past Medical History:  Past Medical History:  Diagnosis Date  . Allergy   . Endometriosis     Past Surgical History:  Past Surgical History:  Procedure Laterality Date  . BREAST BIOPSY  05/2006  . CHOLECYSTECTOMY  2009  . NASAL SINUS SURGERY  2003    Gynecologic History:  Patient's last menstrual period was 07/09/2020. Menarche: 11  History of fibroids, polyps, or ovarian cysts? : history of ovarian cysts History of PCOS? no Hstory of Endometriosis? no History of abnormal pap smears? Last pap ASCUS hpv negative Have you had any sexually transmitted infections in the past? Not answered  Last Pap: Results were: 2019 ASCUS with NEGATIVE high risk HPV   She identifies as a female. She is sexually active with men.   She has and denies dyspareunia. She has postcoital bleeding.    Obstetric History: [id number]  Family History:  Family History  Problem Relation Age of Onset  . Hypertension Mother   . Breast cancer Mother   . Hypertension Father   . Colon cancer Father   . Alzheimer's disease Father   . Epilepsy Maternal Uncle   . Hypertension Maternal Grandfather   . CAD Maternal Grandfather   . Lung cancer Maternal Grandfather   . Parkinsonism Paternal Grandmother     Social History:  Social  History   Socioeconomic History  . Marital status: Married    Spouse name: Not on file  . Number of children: Not on file  . Years of education: Not on file  . Highest education level: Not on file  Occupational History  . Not on file  Tobacco Use  . Smoking status: Never Smoker  . Smokeless tobacco: Never Used  Vaping Use  . Vaping Use: Never used  Substance  and Sexual Activity  . Alcohol use: No    Alcohol/week: 0.0 standard drinks  . Drug use: No  . Sexual activity: Yes    Birth control/protection: Other-see comments    Comment: vasectomy  Other Topics Concern  . Not on file  Social History Narrative  . Not on file   Social Determinants of Health   Financial Resource Strain: Not on file  Food Insecurity: Not on file  Transportation Needs: Not on file  Physical Activity: Not on file  Stress: Not on file  Social Connections: Not on file  Intimate Partner Violence: Not on file    Allergies:  Allergies  Allergen Reactions  . Amoxicillin Itching  . Levaquin  [Levofloxacin In D5w] Rash    Medications: Prior to Admission medications   Medication Sig Start Date End Date Taking? Authorizing Provider  Calcium-Magnesium-Vitamin D 200-100-33.3 MG-MG-UNIT CAPS Take by mouth.   Yes [provider]  MULTIPLE VITAMIN PO Take 1 capsule by mouth daily.   Yes [provider]  OVER THE COUNTER MEDICATION    Yes [provider]    Physical Exam Vitals: Blood pressure 120/70, height 5\' 5"  (1.651 m), weight 193 lb (87.5 kg), last menstrual period 07/09/2020.  Physical Exam Constitutional:      Appearance: She is well-developed.  Genitourinary:     Genitourinary Comments: External: Normal appearing vulva. No lesions noted.  Speculum examination: Normal appearing cervix. No blood in the vaginal vault. white discharge.   Bimanual examination: Uterus midline, non-tender, normal in size, shape and contour.  No CMT. No adnexal masses. No adnexal tenderness. Pelvis not fixed.  Breast Exam: breast equal without skin changes, nipple discharge, breast lump or enlarged lymph nodes   HENT:     Head: Normocephalic and atraumatic.  Neck:     Thyroid: No thyromegaly.  Cardiovascular:     Rate and Rhythm: Normal rate and regular rhythm.     Heart sounds: Normal heart sounds.  Pulmonary:     Effort: Pulmonary effort is  normal.     Breath sounds: Normal breath sounds.  Abdominal:     General: Bowel sounds are normal. There is no distension.     Palpations: Abdomen is soft. There is no mass.  Musculoskeletal:     Cervical back: Neck supple.  Neurological:     Mental Status: She is alert and oriented to person, place, and time.  Skin:    General: Skin is warm and dry.  Psychiatric:        Behavior: Behavior normal.        Thought Content: Thought content normal.        Judgment: Judgment normal.  Vitals reviewed.      Female chaperone present for pelvic and breast  portions of the physical exam  Assessment: 50 y.o. 43 routine annual exam  Plan: Problem List Items Addressed This Visit   None   Visit Diagnoses    Abnormal uterine bleeding    -  Primary   Relevant Orders   CBC  TSH   Prolactin   Ferritin   US PELVIS (TRANSABDOMINAL ONLY)   Encounter for annual routine gynecological examination       Health maintenance examination       Breast cancer screening by mammogram       Relevant Orders   MM 3D SCREEN BREAST BILATERAL   Cervical cancer screening       Relevant Orders   Cytology - PAP   Encounter for screening breast examination       Encounter for gynecological examination without abnormal finding       Colon cancer screening       Relevant Orders   Cologuard      1) Mammogram - recommend yearly screening mammogram.  Mammogram Was ordered today  2) STI screening was offered and declined  3) ASCCP guidelines and rational discussed.  Patient opts for every 3 years screening interval  4) Abnormal uterine bleeding and menorrhagia- labs today, will follow up for GYN [place] and EMB  5) Colonoscopy -- discussed, ordered cologuard  6) Routine healthcare maintenance including cholesterol, diabetes screening discussed managed by PCP.   Adelene Idler MD, [facility], [facility] 07/19/2020 4:35 PM

## 2020-07-20 LAB — CBC
Hematocrit: 41.2 % (ref 34.0–46.6)
Hemoglobin: 13.3 g/dL (ref 11.1–15.9)
MCH: 29.3 pg (ref 26.6–33.0)
MCHC: 32.3 g/dL (ref 31.5–35.7)
MCV: 91 fL (ref 79–97)
Platelets: 275 10*3/uL (ref 150–450)
RBC: 4.54 x10E6/uL (ref 3.77–5.28)
RDW: 13.6 % (ref 11.7–15.4)
WBC: 7.9 10*3/uL (ref 3.4–10.8)

## 2020-07-20 LAB — TSH: TSH: 2.14 u[IU]/mL (ref 0.450–4.500)

## 2020-07-20 LAB — FERRITIN: Ferritin: 15 ng/mL (ref 15–150)

## 2020-07-20 LAB — PROLACTIN: Prolactin: 6 ng/mL (ref 4.8–23.3)

## 2020-07-24 LAB — CYTOLOGY - PAP
Comment: NEGATIVE
Diagnosis: NEGATIVE
High risk HPV: NEGATIVE

## 2020-07-25 ENCOUNTER — Encounter: Payer: Self-pay | Admitting: Obstetrics and Gynecology

## 2020-08-27 LAB — COLOGUARD: Cologuard: NEGATIVE

## 2020-09-05 ENCOUNTER — Encounter: Payer: Self-pay | Admitting: Obstetrics and Gynecology

## 2020-09-10 ENCOUNTER — Ambulatory Visit: Payer: PRIVATE HEALTH INSURANCE | Admitting: Obstetrics and Gynecology

## 2020-09-18 ENCOUNTER — Other Ambulatory Visit (HOSPITAL_COMMUNITY)
Admission: RE | Admit: 2020-09-18 | Discharge: 2020-09-18 | Disposition: A | Discharge status: Home / Self Care | Payer: Self-pay | Source: Ambulatory Visit | Attending: Obstetrics and Gynecology | Admitting: Obstetrics and Gynecology

## 2020-09-18 ENCOUNTER — Encounter: Payer: Self-pay | Admitting: Obstetrics and Gynecology

## 2020-09-18 ENCOUNTER — Ambulatory Visit (INDEPENDENT_AMBULATORY_CARE_PROVIDER_SITE_OTHER): Payer: PRIVATE HEALTH INSURANCE | Admitting: Obstetrics and Gynecology

## 2020-09-18 ENCOUNTER — Other Ambulatory Visit: Payer: Self-pay

## 2020-09-18 VITALS — BP 124/70 | Ht 65.0 in | Wt 191.6 lb

## 2020-09-18 DIAGNOSIS — R9389 Abnormal findings on diagnostic imaging of other specified body structures: Secondary | ICD-10-CM

## 2020-09-18 DIAGNOSIS — N924 Excessive bleeding in the premenopausal period: Secondary | ICD-10-CM | POA: Diagnosis not present

## 2020-09-18 DIAGNOSIS — N939 Abnormal uterine and vaginal bleeding, unspecified: Secondary | ICD-10-CM | POA: Diagnosis not present

## 2020-09-18 NOTE — Patient Instructions (Signed)
Endometrial Biopsy  An endometrial biopsy is a procedure to remove tissue samples from the endometrium, which is the lining of the uterus. The tissue that is removed can then be checked under a microscope for disease. This procedure is used to diagnose conditions such as endometrial cancer, endometrial tuberculosis, polyps, or other inflammatory conditions. This procedure may also be used to investigate uterine bleeding to determine where you are in your menstrual cycle or how your hormone levels are affecting the lining of the uterus. Tell a health care provider about:  Any allergies you have.  All medicines you are taking, including vitamins, herbs, eye drops, creams, and over-the-counter medicines.  Any problems you or family members have had with anesthetic medicines.  Any blood disorders you have.  Any surgeries you have had.  Any medical conditions you have.  Whether you are pregnant or may be pregnant. What are the risks? Generally, this is a safe procedure. However, problems may occur, including:  Bleeding.  Pelvic infection.  Puncture of the wall of the uterus with the biopsy device (rare).  Allergic reactions to medicines. What happens before the procedure?  Keep a record of your menstrual cycles as told by your health care provider. You may need to schedule your procedure for a specific time in your cycle.  You may want to bring a sanitary pad to wear after the procedure.  Plan to have someone take you home from the hospital or clinic.  Ask your health care provider about: ? Changing or stopping your regular medicines. This is especially important if you are taking diabetes medicines, arthritis medicines, or blood thinners. ? Taking medicines such as aspirin and ibuprofen. These medicines can thin your blood. Do not take these medicines unless your health care provider tells you to take them. ? Taking over-the-counter medicines, vitamins, herbs, and  supplements. What happens during the procedure?  You will lie on an exam table with your feet and legs supported as in a pelvic exam.  Your health care provider will insert an instrument (speculum) into your vagina to see your cervix.  Your cervix will be cleansed with an antiseptic solution.  A medicine (local anesthetic) will be used to numb the cervix.  A forceps instrument (tenaculum) will be used to hold your cervix steady for the biopsy.  A thin, rod-like instrument (uterine sound) will be inserted through your cervix to determine the length of your uterus and the location where the biopsy sample will be removed.  A thin, flexible tube (catheter) will be inserted through your cervix and into the uterus. The catheter will be used to collect the biopsy sample from your endometrial tissue.  The catheter and speculum will then be removed, and the tissue sample will be sent to a lab for examination. The procedure may vary among health care providers and hospitals. What can I expect after procedure?  You will rest in a recovery area until you are ready to go home.  You may have mild cramping and a small amount of vaginal bleeding. This is normal.  You may have a small amount of vaginal bleeding for a few days. This is normal.  It is up to you to get the results of your procedure. Ask your health care provider, or the department that is doing the procedure, when your results will be ready. Follow these instructions at home:  Take over-the-counter and prescription medicines only as told by your health care provider.  Do not douche, use tampons, or have   sexual intercourse until your health care provider approves.  Return to your normal activities as told by your health care provider. Ask your health care provider what activities are safe for you.  Follow instructions from your health care provider about any activity restrictions, such as restrictions on strenuous exercise or heavy  lifting.  Keep all follow-up visits. This is important. Contact a health care provider:  You have heavy bleeding, or bleed for longer than 2 days after the procedure.  You have bad smelling discharge from your vagina.  You have a fever or chills.  You have a burning sensation when urinating or you have difficulty urinating.  You have severe pain in your lower abdomen. Get help right away if you:  You have severe cramps in your stomach or back.  You pass large blood clots.  Your bleeding increases.  You become weak or light-headed, or you faint or lose consciousness. Summary  An endometrial biopsy is a procedure to remove tissue samples is taken from the endometrium, which is the lining of the uterus.  The tissue sample that is removed will be checked under a microscope for disease.  This procedure is used to diagnose conditions such as endometrial cancer, endometrial tuberculosis, polyps, or other inflammatory conditions.  After the procedure, it is common to have mild cramping and a small amount of vaginal bleeding for a few days.  Do not douche, use tampons, or have sexual intercourse until your health care provider approves. Ask your health care provider which activities are safe for you. This information is not intended to replace advice given to you by your health care provider. Make sure you discuss any questions you have with your health care provider. Document Revised: 11/14/2019 Document Reviewed: 11/14/2019 Elsevier Patient Education  2021 Elsevier Inc.  

## 2020-09-18 NOTE — Progress Notes (Signed)
Patient ID: Melissa Neal, female   DOB: [birth year], 50 y.o.   MRN: [id number]  Reason for Consult: Gynecologic Exam   Referred by Malva Limes, MD  Subjective:     HPI:  Melissa Neal is a 50 y.o. female. She is following up for menorrhagia. She report that she has not had a period since January. She had a pelvic US at an outside facility on May 4th 2022.  Review of that report showed an endometrial thickness of 10 mm. Right ovary was normal in size. Left ovary was not seen.   Gynecological History  Patient's last menstrual period was 05/14/2020.  Last Pap: Results were: 07/19/2020 NIL and HR HPV negative   She identifies as a female.   Obstetrical History  OB History  Gravida Para Term Preterm AB Living  4 4 4     4   SAB IAB Ectopic Multiple Live Births          4    # Outcome Date GA Lbr Len/2nd Weight Sex Delivery Anes PTL Lv  4 Term 11/17/08    M Vag-Spont   LIV  3 Term 05/26/07    M Vag-Spont   LIV  2 Term 02/06/00    M Vag-Spont   LIV  1 Term 06/29/98    07/01/98   LIV     Past Medical History:  Diagnosis Date  . Allergy   . Endometriosis   . Family history of breast cancer    3/22 cancer genetic testing letter sent   Family History  Problem Relation Age of Onset  . Hypertension Mother   . Breast cancer Mother 39  . Hypertension Father   . Colon cancer Father 51  . Alzheimer's disease Father   . Epilepsy Maternal Uncle   . Hypertension Maternal Grandfather   . CAD Maternal Grandfather   . Lung cancer Maternal Grandfather   . Parkinsonism Paternal Grandmother   . Pancreatic cancer Maternal Grandmother 96   Past Surgical History:  Procedure Laterality Date  . BREAST BIOPSY  05/2006  . CHOLECYSTECTOMY  2009  . NASAL SINUS SURGERY  2003    Short Social History:  Social History   Tobacco Use  . Smoking status: Never Smoker  . Smokeless tobacco: Never Used  Substance Use Topics  . Alcohol use: No    Alcohol/week: 0.0 standard  drinks    Allergies  Allergen Reactions  . Amoxicillin Itching  . Levaquin  [Levofloxacin In D5w] Rash    Current Outpatient Medications  Medication Sig Dispense Refill  . Calcium-Magnesium-Vitamin D 200-100-33.3 MG-MG-UNIT CAPS Take by mouth.    . MULTIPLE VITAMIN PO Take 1 capsule by mouth daily.    2004 OVER THE COUNTER MEDICATION  (Patient not taking: Reported on 09/18/2020)     No current facility-administered medications for this visit.    Review of Systems  Constitutional: Negative for chills, fatigue, fever and unexpected weight change.  HENT: Negative for trouble swallowing.  Eyes: Negative for loss of vision.  Respiratory: Negative for cough, shortness of breath and wheezing.  Cardiovascular: Negative for chest pain, leg swelling, palpitations and syncope.  GI: Negative for abdominal pain, blood in stool, diarrhea, nausea and vomiting.  GU: Negative for difficulty urinating, dysuria, frequency and hematuria.  Musculoskeletal: Negative for back pain, leg pain and joint pain.  Skin: Negative for rash.  Neurological: Negative for dizziness, headaches, light-headedness, numbness and seizures.  Psychiatric: Negative for behavioral problem, confusion, depressed mood and  sleep disturbance.        Objective:  Objective   Vitals:   09/18/20 1402  BP: 124/70  Weight: 191 lb 9.6 oz (86.9 kg)  Height: 5\' 5"  (1.651 m)   Body mass index is 31.88 kg/m.  Physical Exam Vitals and nursing note reviewed. Exam conducted with a chaperone present.  Constitutional:      Appearance: Normal appearance.  HENT:     Head: Normocephalic and atraumatic.  Eyes:     Extraocular Movements: Extraocular movements intact.     Pupils: Pupils are equal, round, and reactive to light.  Cardiovascular:     Rate and Rhythm: Normal rate and regular rhythm.  Pulmonary:     Effort: Pulmonary effort is normal.     Breath sounds: Normal breath sounds.  Abdominal:     General: Abdomen is flat.      Palpations: Abdomen is soft.  Genitourinary:    Comments: External: Normal appearing vulva. No lesions noted.  Speculum examination: Normal appearing cervix. No blood in the vaginal vault. no discharge.   Musculoskeletal:     Cervical back: Normal range of motion.  Skin:    General: Skin is warm and dry.  Neurological:     General: No focal deficit present.     Mental Status: She is alert and oriented to person, place, and time.  Psychiatric:        Behavior: Behavior normal.        Thought Content: Thought content normal.        Judgment: Judgment normal.     Endometrial Biopsy After discussion with the patient regarding her abnormal uterine bleeding I recommended that she proceed with an endometrial biopsy for further diagnosis. The risks, benefits, alternatives, and indications for an endometrial biopsy were discussed with the patient in detail. She understood the risks including infection, bleeding, cervical laceration and uterine perforation.  Verbal consent was obtained.   PROCEDURE NOTE:  Pipelle endometrial biopsy was performed using aseptic technique with iodine preparation.  The uterus was sounded to a length of 8 cm.  Adequate sampling was obtained with minimal blood loss.  The patient tolerated the procedure well.  Disposition will be pending pathology.   Assessment/Plan:     50 yo with menorrhagia and abnormal uterine bleeding We discussed that an endometrial thickness is greater than 4 mm should be sampled in the setting of postmenopausal bleeding. She has not had formal FSH testing, but the thought has been that she is transitioning into menopause. For this reason, she elected for EMB today in office.   Reviewed normal Cologuard report - plan to repeat Cologuard in 3 year.   More than 20 minutes were spent face to face with the patient in the room, reviewing the medical record, labs and images, and coordinating care for the patient. The plan of management was discussed  in detail and counseling was provided.     44 MD [facility], [facility] 09/18/2020 2:17 PM

## 2020-09-20 LAB — SURGICAL PATHOLOGY

## 2021-07-15 ENCOUNTER — Other Ambulatory Visit: Payer: Self-pay

## 2021-07-15 ENCOUNTER — Emergency Department
Admission: EM | Admit: 2021-07-15 | Discharge: 2021-07-15 | Disposition: A | Discharge status: Home / Self Care | Payer: Self-pay | Attending: Emergency Medicine | Admitting: Emergency Medicine

## 2021-07-15 ENCOUNTER — Encounter: Payer: Self-pay | Admitting: Emergency Medicine

## 2021-07-15 DIAGNOSIS — T445X1A Poisoning by predominantly beta-adrenoreceptor agonists, accidental (unintentional), initial encounter: Secondary | ICD-10-CM | POA: Insufficient documentation

## 2021-07-15 DIAGNOSIS — S61031A Puncture wound without foreign body of right thumb without damage to nail, initial encounter: Secondary | ICD-10-CM | POA: Insufficient documentation

## 2021-07-15 DIAGNOSIS — W458XXA Other foreign body or object entering through skin, initial encounter: Secondary | ICD-10-CM | POA: Insufficient documentation

## 2021-07-15 LAB — CBC
HCT: 44.2 % (ref 36.0–46.0)
Hemoglobin: 14.4 g/dL (ref 12.0–15.0)
MCH: 29.9 pg (ref 26.0–34.0)
MCHC: 32.6 g/dL (ref 30.0–36.0)
MCV: 91.7 fL (ref 80.0–100.0)
Platelets: 210 10*3/uL (ref 150–400)
RBC: 4.82 MIL/uL (ref 3.87–5.11)
RDW: 14.1 % (ref 11.5–15.5)
WBC: 8.1 10*3/uL (ref 4.0–10.5)
nRBC: 0 % (ref 0.0–0.2)

## 2021-07-15 LAB — TROPONIN I (HIGH SENSITIVITY)
Troponin I (High Sensitivity): 5 ng/L (ref <18)
Troponin I (High Sensitivity): 6 ng/L (ref <18)

## 2021-07-15 LAB — BASIC METABOLIC PANEL
Anion gap: 6 (ref 5–15)
BUN: 14 mg/dL (ref 6–20)
CO2: 29 mmol/L (ref 22–32)
Calcium: 9.1 mg/dL (ref 8.9–10.3)
Chloride: 102 mmol/L (ref 98–111)
Creatinine, Ser: 1.04 mg/dL — ABNORMAL HIGH (ref 0.44–1.00)
GFR, Estimated: >60 mL/min (ref >60)
Glucose, Bld: 115 mg/dL — ABNORMAL HIGH (ref 70–99)
Potassium: 3.5 mmol/L (ref 3.5–5.1)
Sodium: 137 mmol/L (ref 135–145)

## 2021-07-15 MED ORDER — NITROGLYCERIN 2 % TD OINT
0.5000 [in_us] | TOPICAL_OINTMENT | Freq: Once | TRANSDERMAL | Status: AC
  Administered 2021-07-15: 0.5 [in_us] via TOPICAL
  Filled 2021-07-15: qty 1

## 2021-07-15 NOTE — Discharge Instructions (Addendum)
-  Return to the emergency department anytime if you begin to experience any new or worsening symptoms. ? ?

## 2021-07-15 NOTE — ED Provider Notes (Signed)
? ?[facility] ?Provider Note ? ? ? Event Date/Time  ? First MD Initiated Contact with Patient 07/15/21 1756   ?  (approximate) ? ? ?History  ? ?Chief Complaint ?No chief complaint on file. ? ? ?HPI ?Melissa Neal is a 51 y.o. female, history of endometriosis, sinusitis, presents to the emergency department for evaluation of EpiPen exposure.  Patient states that she was trying to give her mom an EpiPen due to allergic reaction she was having when she accidentally injected the epinephrine into her left thumb instead.  Patient states that her is experiencing some mild pain, though sensation is intact.  She says that her thumb is notably whiter in the other thumb.  Denies fever/chills, chest pain, shortness of breath, abdominal pain, back pain, urinary symptoms, nausea/vomiting, headache, dizziness, or rashes/lesions. ? ?History Limitations: No limitations. ? ?  ? ? ?Physical Exam  ?Triage Vital Signs: ?ED Triage Vitals  ?Enc Vitals Group  ?   BP 07/15/21 1717 117/81  ?   Pulse Rate 07/15/21 1717 (!) 106  ?   Resp 07/15/21 1717 18  ?   Temp 07/15/21 1717 99 ?F (37.2 ?C)  ?   Temp Source 07/15/21 1717 Oral  ?   SpO2 07/15/21 1717 94 %  ?   Weight 07/15/21 1713 190 lb (86.2 kg)  ?   Height 07/15/21 1713 5\' 5"  (1.651 m)  ?   Head Circumference --   ?   Peak Flow --   ?   Pain Score 07/15/21 1713 1  ?   Pain Loc --   ?   Pain Edu? --   ?   Excl. in GC? --   ? ? ?Most recent vital signs: ?Vitals:  ? 07/15/21 1717  ?BP: 117/81  ?Pulse: (!) 106  ?Resp: 18  ?Temp: 99 ?F (37.2 ?C)  ?SpO2: 94%  ? ? ?General: Awake, NAD.  ?CV: Good peripheral perfusion.  ?Resp: Normal effort.  ?Abd: Soft, non-tender. No distention.  ?Neuro: At baseline. No gross neurological deficits.  ?Other: Small puncture wound appreciated along the palmar aspect of the right thumb.  No active bleeding or discharge.  Digit is notably blanched compared to the rest of her digits.  Cap refill less than 3 seconds.  Patient maintains  normal range of motion of her thumb.  ? ?Physical Exam ? ? ? ?ED Results / Procedures / Treatments  ?Labs ?(all labs ordered are listed, but only abnormal results are displayed) ?Labs Reviewed  ?BASIC METABOLIC PANEL - Abnormal; Notable for the following components:  ?    Result Value  ? Glucose, Bld 115 (*)   ? Creatinine, Ser 1.04 (*)   ? All other components within normal limits  ?CBC  ?POC URINE PREG, ED  ?TROPONIN I (HIGH SENSITIVITY)  ?TROPONIN I (HIGH SENSITIVITY)  ? ? ? ?EKG ?Sinus rhythm, rate of 96, no ST segment changes, no AV blocks, no axis deviations, normal QRS interval. ? ? ?RADIOLOGY ? ?ED Provider Interpretation: Not applicable ? ?No results found. ? ?PROCEDURES: ? ?Critical Care performed: Not applicable ? ?Procedures ? ? ? ?MEDICATIONS ORDERED IN ED: ?Medications  ?nitroGLYCERIN (NITROGLYN) 2 % ointment 0.5 inch (0.5 inches Topical Given 07/15/21 1829)  ? ? ? ?IMPRESSION / MDM / ASSESSMENT AND PLAN / ED COURSE  ?I reviewed the triage vital signs and the nursing notes. ?             ?               ? ? ?  Differential diagnosis includes, but is not limited to, accidental epinephrine exposure, digital ischemia, ACS. ? ?ED Course ?Patient appears well.  Vital signs within normal limits, though mildly tachycardic at 106.  NAD. ? ?CBC shows no leukocytosis or anemia.  BMP unremarkable for major electrolyte abnormalities or significant kidney injury. ? ?Initial troponin 5.  Second troponin 6.  Unremarkable EKG.  Unlikely ACS. ? ?Assessment/Plan ?Patient presents with accidental EpiPen exposure.  Unknown quality amount of epinephrine, though digit is notably blanched.  Applied nitroglycerin paste to the affected digit and soaked with warm water, per guidance from poison control.  Upon reevaluation 2 hours later, her thumb has returned to normal color.  She is not currently endorsing any chest pain or shortness of breath.  Unlikely any cardiac ischemia given unremarkable troponin and no significant changes  on EKG.  We will plan to discharge this patient. ? ?Patient was provided with anticipatory guidance, return precautions, and educational material. Encouraged the patient to return to the emergency department at any time if they begin to experience any new or worsening symptoms.  ? ?  ? ? ?FINAL CLINICAL IMPRESSION(S) / ED DIAGNOSES  ? ?Final diagnoses:  ?Epinephrine poisoning, accidental or unintentional, initial encounter  ? ? ? ?Rx / DC Orders  ? ?ED Discharge Orders   ? ? None  ? ?  ? ? ? ?Note:  This document was prepared using Dragon voice recognition software and may include unintentional dictation errors. ?  ?Varney Daily, [place] ?07/15/21 2025 ? ?  ?Georga Hacking, MD ?07/15/21 2136 ? ?

## 2021-07-15 NOTE — ED Triage Notes (Signed)
Pt to ED via POV with c/o she was giving her mom an EPI pen and got her self instead in her thumb. Her thumb is white and is hurting. She denies any other symptoms ?
